# Patient Record
Sex: Female | Born: 1960 | Race: White | Hispanic: No | Marital: Single | Smoking: Never smoker
Health system: Southern US, Community
[De-identification: ages and names within clinical notes are randomized; demographics above are authoritative.]

## PROBLEM LIST (undated history)

## (undated) DIAGNOSIS — I10 Essential (primary) hypertension: Secondary | ICD-10-CM

## (undated) HISTORY — PX: BREAST BIOPSY: SHX20

---

## 2017-03-14 ENCOUNTER — Ambulatory Visit (INDEPENDENT_AMBULATORY_CARE_PROVIDER_SITE_OTHER): Payer: 59

## 2017-03-14 ENCOUNTER — Other Ambulatory Visit: Payer: Self-pay | Admitting: Internal Medicine

## 2017-03-14 DIAGNOSIS — Z1231 Encounter for screening mammogram for malignant neoplasm of breast: Secondary | ICD-10-CM | POA: Diagnosis not present

## 2017-03-16 ENCOUNTER — Other Ambulatory Visit: Payer: Self-pay | Admitting: Internal Medicine

## 2017-03-16 DIAGNOSIS — Z1231 Encounter for screening mammogram for malignant neoplasm of breast: Secondary | ICD-10-CM

## 2019-10-09 ENCOUNTER — Other Ambulatory Visit: Payer: Self-pay | Admitting: Internal Medicine

## 2019-10-09 DIAGNOSIS — Z1231 Encounter for screening mammogram for malignant neoplasm of breast: Secondary | ICD-10-CM

## 2019-10-16 ENCOUNTER — Ambulatory Visit (INDEPENDENT_AMBULATORY_CARE_PROVIDER_SITE_OTHER): Payer: PRIVATE HEALTH INSURANCE

## 2019-10-16 ENCOUNTER — Other Ambulatory Visit: Payer: Self-pay

## 2019-10-16 DIAGNOSIS — Z1231 Encounter for screening mammogram for malignant neoplasm of breast: Secondary | ICD-10-CM | POA: Diagnosis not present

## 2020-11-09 ENCOUNTER — Other Ambulatory Visit: Payer: Self-pay | Admitting: Internal Medicine

## 2020-11-09 ENCOUNTER — Other Ambulatory Visit: Payer: Self-pay | Admitting: Plastic and Reconstructive Surgery

## 2020-11-09 DIAGNOSIS — Z1231 Encounter for screening mammogram for malignant neoplasm of breast: Secondary | ICD-10-CM

## 2020-12-09 ENCOUNTER — Ambulatory Visit: Payer: PRIVATE HEALTH INSURANCE

## 2021-03-17 ENCOUNTER — Ambulatory Visit (HOSPITAL_BASED_OUTPATIENT_CLINIC_OR_DEPARTMENT_OTHER)
Admission: RE | Admit: 2021-03-17 | Discharge: 2021-03-17 | Disposition: A | Discharge status: Home / Self Care | Payer: PRIVATE HEALTH INSURANCE | Source: Ambulatory Visit | Attending: Internal Medicine | Admitting: Internal Medicine

## 2021-03-17 ENCOUNTER — Other Ambulatory Visit: Payer: Self-pay

## 2021-03-17 DIAGNOSIS — Z1231 Encounter for screening mammogram for malignant neoplasm of breast: Secondary | ICD-10-CM | POA: Diagnosis not present

## 2021-03-18 ENCOUNTER — Other Ambulatory Visit (HOSPITAL_BASED_OUTPATIENT_CLINIC_OR_DEPARTMENT_OTHER): Payer: Self-pay

## 2021-03-18 MED ORDER — SERTRALINE HCL 100 MG PO TABS
ORAL_TABLET | ORAL | 3 refills | Status: DC
  Filled 2021-03-18 – 2021-07-12 (×2): qty 135, 90d supply, fill #0
  Filled 2021-11-07: qty 135, 90d supply, fill #1
  Filled 2022-03-14: qty 45, 30d supply, fill #2

## 2021-03-18 MED ORDER — ZOLPIDEM TARTRATE 10 MG PO TABS
ORAL_TABLET | ORAL | 5 refills | Status: DC
  Filled 2021-03-18: qty 30, 30d supply, fill #0
  Filled 2021-04-19: qty 30, 30d supply, fill #1
  Filled 2021-05-19: qty 30, 30d supply, fill #2
  Filled 2021-06-18: qty 30, 30d supply, fill #3
  Filled 2021-07-20: qty 30, 30d supply, fill #4
  Filled 2021-08-18: qty 30, 30d supply, fill #5

## 2021-03-18 MED ORDER — AMLODIPINE BESYLATE 5 MG PO TABS
ORAL_TABLET | ORAL | 3 refills | Status: AC
  Filled 2021-03-18 – 2021-05-19 (×2): qty 90, 90d supply, fill #0

## 2021-03-19 ENCOUNTER — Other Ambulatory Visit (HOSPITAL_BASED_OUTPATIENT_CLINIC_OR_DEPARTMENT_OTHER): Payer: Self-pay

## 2021-04-13 ENCOUNTER — Other Ambulatory Visit (HOSPITAL_BASED_OUTPATIENT_CLINIC_OR_DEPARTMENT_OTHER): Payer: Self-pay

## 2021-04-19 ENCOUNTER — Other Ambulatory Visit (HOSPITAL_BASED_OUTPATIENT_CLINIC_OR_DEPARTMENT_OTHER): Payer: Self-pay

## 2021-04-20 ENCOUNTER — Other Ambulatory Visit (HOSPITAL_BASED_OUTPATIENT_CLINIC_OR_DEPARTMENT_OTHER): Payer: Self-pay

## 2021-05-19 ENCOUNTER — Other Ambulatory Visit (HOSPITAL_BASED_OUTPATIENT_CLINIC_OR_DEPARTMENT_OTHER): Payer: Self-pay

## 2021-05-19 MED ORDER — AMLODIPINE BESYLATE 5 MG PO TABS: 5.0000 mg | ORAL_TABLET | Freq: Every day | ORAL | 1 refills | Status: DC

## 2021-05-19 MED ORDER — ALBUTEROL SULFATE HFA 108 (90 BASE) MCG/ACT IN AERS: INHALATION_SPRAY | RESPIRATORY_TRACT | 1 refills | Status: DC

## 2021-05-19 MED ORDER — ESTRADIOL 1 MG PO TABS
1.0000 mg | ORAL_TABLET | Freq: Every day | ORAL | 0 refills | Status: AC
  Filled 2021-07-12: qty 90, 90d supply, fill #0

## 2021-05-19 MED ORDER — BUSPIRONE HCL 10 MG PO TABS
10.0000 mg | ORAL_TABLET | Freq: Two times a day (BID) | ORAL | 0 refills | Status: DC
  Filled 2021-05-19: qty 180, 90d supply, fill #0

## 2021-05-19 MED ORDER — BISOPROLOL-HYDROCHLOROTHIAZIDE 5-6.25 MG PO TABS
1.0000 | ORAL_TABLET | Freq: Every day | ORAL | 3 refills | Status: AC
  Filled 2021-05-19: qty 90, 90d supply, fill #0

## 2021-05-20 ENCOUNTER — Other Ambulatory Visit (HOSPITAL_BASED_OUTPATIENT_CLINIC_OR_DEPARTMENT_OTHER): Payer: Self-pay

## 2021-05-27 ENCOUNTER — Other Ambulatory Visit (HOSPITAL_BASED_OUTPATIENT_CLINIC_OR_DEPARTMENT_OTHER): Payer: Self-pay

## 2021-05-27 MED ORDER — LOSARTAN POTASSIUM 100 MG PO TABS
ORAL_TABLET | ORAL | 3 refills | Status: DC
  Filled 2021-05-27: qty 90, 90d supply, fill #0
  Filled 2021-08-19: qty 90, 90d supply, fill #1
  Filled 2021-11-23: qty 60, 60d supply, fill #2

## 2021-06-18 ENCOUNTER — Other Ambulatory Visit (HOSPITAL_BASED_OUTPATIENT_CLINIC_OR_DEPARTMENT_OTHER): Payer: Self-pay

## 2021-06-24 ENCOUNTER — Emergency Department (INDEPENDENT_AMBULATORY_CARE_PROVIDER_SITE_OTHER)
Admission: EM | Admit: 2021-06-24 | Discharge: 2021-06-24 | Disposition: A | Discharge status: Home / Self Care | Payer: No Typology Code available for payment source | Source: Home / Self Care | Attending: Family Medicine | Admitting: Family Medicine

## 2021-06-24 ENCOUNTER — Emergency Department (INDEPENDENT_AMBULATORY_CARE_PROVIDER_SITE_OTHER): Payer: No Typology Code available for payment source

## 2021-06-24 ENCOUNTER — Other Ambulatory Visit: Payer: Self-pay

## 2021-06-24 ENCOUNTER — Encounter: Payer: Self-pay | Admitting: Emergency Medicine

## 2021-06-24 DIAGNOSIS — R1084 Generalized abdominal pain: Secondary | ICD-10-CM

## 2021-06-24 DIAGNOSIS — Z8719 Personal history of other diseases of the digestive system: Secondary | ICD-10-CM

## 2021-06-24 DIAGNOSIS — R509 Fever, unspecified: Secondary | ICD-10-CM

## 2021-06-24 HISTORY — DX: Essential (primary) hypertension: I10

## 2021-06-24 LAB — POCT URINALYSIS DIP (MANUAL ENTRY)
Bilirubin, UA: NEGATIVE
Blood, UA: NEGATIVE
Glucose, UA: NEGATIVE mg/dL
Ketones, POC UA: NEGATIVE mg/dL
Leukocytes, UA: NEGATIVE
Nitrite, UA: NEGATIVE
Protein Ur, POC: NEGATIVE mg/dL
Spec Grav, UA: 1.02 (ref 1.010–1.025)
Urobilinogen, UA: 0.2 E.U./dL
pH, UA: 5.5 (ref 5.0–8.0)

## 2021-06-24 LAB — CBC WITH DIFFERENTIAL/PLATELET
Absolute Monocytes: 576 cells/uL (ref 200–950)
Basophils Absolute: 72 cells/uL (ref 0–200)
Basophils Relative: 1.2 %
Eosinophils Absolute: 108 cells/uL (ref 15–500)
Eosinophils Relative: 1.8 %
HCT: 41.4 % (ref 35.0–45.0)
Hemoglobin: 14 g/dL (ref 11.7–15.5)
Lymphs Abs: 1134 cells/uL (ref 850–3900)
MCH: 30.6 pg (ref 27.0–33.0)
MCHC: 33.8 g/dL (ref 32.0–36.0)
MCV: 90.4 fL (ref 80.0–100.0)
MPV: 10.8 fL (ref 7.5–12.5)
Monocytes Relative: 9.6 %
Neutro Abs: 4110 cells/uL (ref 1500–7800)
Neutrophils Relative %: 68.5 %
Platelets: 214 10*3/uL (ref 140–400)
RBC: 4.58 10*6/uL (ref 3.80–5.10)
RDW: 12.4 % (ref 11.0–15.0)
Total Lymphocyte: 18.9 %
WBC: 6 10*3/uL (ref 3.8–10.8)

## 2021-06-24 MED ORDER — HYDROCODONE-ACETAMINOPHEN 5-325 MG PO TABS: 1.0000 | ORAL_TABLET | Freq: Four times a day (QID) | ORAL | 0 refills | Status: AC | PRN

## 2021-06-24 MED ORDER — CIPROFLOXACIN HCL 500 MG PO TABS: 500.0000 mg | ORAL_TABLET | Freq: Two times a day (BID) | ORAL | 0 refills | Status: AC

## 2021-06-24 MED ORDER — METRONIDAZOLE 500 MG PO TABS: 500.0000 mg | ORAL_TABLET | Freq: Two times a day (BID) | ORAL | 0 refills | Status: AC

## 2021-06-24 NOTE — ED Triage Notes (Addendum)
Fever & lower abdominal pain since early Wed morning Negative home Covid test yesterday  Pt concerned for diverticulitis- had a flare up 2 years ago - no surgery   Tmax at home 102 on wed  Temp was 101.2 this am  Tylenol @ 1015- 1000mg  No COVID booster Has taken 3 doses of flagyl - left over meds

## 2021-06-24 NOTE — ED Provider Notes (Signed)
[facility]    CSN: [id number] Arrival date & time: 06/24/21  1138      History   Chief Complaint Chief Complaint  Patient presents with   Abdominal Pain    HPI Kathleen [facility] is a 60 y.o. female.   HPI Patient is a 60 year old woman who has had abdominal pain for 3 days.  She has known diverticular disease.  Had an episode of diverticulitis over a year ago.  She states this feels similar with lower abdominal pain.  Decreased appetite.  Bloated.  This time though she has had fever to 102.  Nausea but no vomiting.  No diarrhea.  She is having daily bowel movements, or couple times a day.  No blood or mucus in the stool. Patient has hypertension that is well controlled with medication.  She is compliant with medical care and preventative medicine.  States her immunizations are up-to-date  Past Medical History:  Diagnosis Date   Hypertension     There are no problems to display for this patient.   Past Surgical History:  Procedure Laterality Date   BREAST BIOPSY      OB History   No obstetric history on file.      Home Medications    Prior to Admission medications   Medication Sig Start Date End Date Taking? Authorizing Provider  ciprofloxacin (CIPRO) 500 MG tablet Take 1 tablet (500 mg total) by mouth 2 (two) times daily. 06/24/21  Yes Eustace Moore, MD  HYDROcodone-acetaminophen (NORCO/VICODIN) 5-325 MG tablet Take 1-2 tablets by mouth every 6 (six) hours as needed. 06/24/21  Yes Eustace Moore, MD  metroNIDAZOLE (FLAGYL) 500 MG tablet Take 1 tablet (500 mg total) by mouth 2 (two) times daily. 06/24/21  Yes Eustace Moore, MD  amLODipine (NORVASC) 5 MG tablet TAKE 1 TABLET(5 MG) BY MOUTH DAILY FOR HIGH BLOOD PRESSURE 03/18/21     bisoprolol-hydrochlorothiazide (ZIAC) 5-6.25 MG tablet Take 1 tablet by mouth daily. 06/12/20     busPIRone (BUSPAR) 10 MG tablet Take 1 tablet by mouth twice daily as needed for anxiety. 05/29/20     estradiol (ESTRACE)  1 MG tablet Take 1 tablet (1 mg total) by mouth daily. 10/13/20     losartan (COZAAR) 100 MG tablet TAKE 1 TABLET(100 MG) BY MOUTH DAILY 05/27/21     sertraline (ZOLOFT) 100 MG tablet TAKE 1 AND 1/2 TABLETS(150 MG) BY MOUTH DAILY 03/18/21     zolpidem (AMBIEN) 10 MG tablet TAKE 1 TABLET (10 MG) BY MOUTH EVERY NIGHT AS NEEDED FOR SLEEP 03/18/21       Family History Family History  Problem Relation Age of Onset   Heart failure Mother    Kidney failure Mother    Healthy Father     Social History Social History   Tobacco Use   Smoking status: Never    Passive exposure: Never   Smokeless tobacco: Never  Vaping Use   Vaping Use: Never used  Substance Use Topics   Alcohol use: Yes    Alcohol/week: 4.0 standard drinks    Types: 4 Standard drinks or equivalent per week   Drug use: Never     Allergies   Patient has no known allergies.   Review of Systems Review of Systems See HPI  Physical Exam Triage Vital Signs ED Triage Vitals  Enc Vitals Group     BP 06/24/21 1151 (!) 143/89     Pulse Rate 06/24/21 1151 69     Resp 06/24/21 1151  16     Temp 06/24/21 1151 98.7 F (37.1 C)     Temp Source 06/24/21 1151 Oral     SpO2 06/24/21 1151 97 %     Weight 06/24/21 1156 175 lb (79.4 kg)     Height 06/24/21 1156 5\' 4"  (1.626 m)     Head Circumference --      Peak Flow --      Pain Score 06/24/21 1154 4     Pain Loc --      Pain Edu? --      Excl. in GC? --    No data found.  Updated Vital Signs BP (!) 143/89 (BP Location: Left Arm)   Pulse 69   Temp 98.7 F (37.1 C) (Oral)   Resp 16   Ht 5\' 4"  (1.626 m)   Wt 79.4 kg   SpO2 97%   BMI 30.04 kg/m       Physical Exam Constitutional:      General: She is not in acute distress.    Appearance: She is well-developed.  HENT:     Head: Normocephalic and atraumatic.     Mouth/Throat:     Mouth: Mucous membranes are moist.     Pharynx: No oropharyngeal exudate.  Eyes:     Conjunctiva/sclera: Conjunctivae normal.      Pupils: Pupils are equal, round, and reactive to light.  Cardiovascular:     Rate and Rhythm: Normal rate and regular rhythm.     Heart sounds: Normal heart sounds.  Pulmonary:     Effort: Pulmonary effort is normal. No respiratory distress.     Breath sounds: No wheezing, rhonchi or rales.  Abdominal:     General: Abdomen is protuberant. Bowel sounds are decreased. There is no distension.     Palpations: Abdomen is soft. There is no hepatomegaly or splenomegaly.     Tenderness: There is generalized abdominal tenderness and tenderness in the epigastric area.     Comments: Abdomen does appear slightly rounded.  Bowel sounds are in active.  There is tenderness palpation in the periumbilical and suprapubic region and towards the left lower abdomen.  No masses palpable.  No guarding or rebound  Musculoskeletal:        General: Normal range of motion.     Cervical back: Normal range of motion.  Skin:    General: Skin is warm and dry.  Neurological:     General: No focal deficit present.     Mental Status: She is alert.  Psychiatric:        Mood and Affect: Mood normal.        Behavior: Behavior normal.     UC Treatments / Results  Labs (all labs ordered are listed, but only abnormal results are displayed) Labs Reviewed  CBC WITH DIFFERENTIAL/PLATELET  POCT URINALYSIS DIP (MANUAL ENTRY)    EKG   Radiology DG Abdomen Acute W/Chest  Result Date: 06/24/2021 CLINICAL DATA:  Pain, fever EXAM: DG ABDOMEN ACUTE WITH 1 VIEW CHEST COMPARISON:  None FINDINGS: Linear densities in the left lung base could reflect atelectasis or scarring. Right lung clear. Heart is normal size. No effusions. There is normal bowel gas pattern. No free air. No organomegaly or suspicious calcification. No acute bony abnormality. IMPRESSION: Left base atelectasis or scarring. No acute findings. Electronically Signed   By: M.D.   On: 06/24/2021 12:41    Procedures Procedures (including critical care  time)  Medications Ordered in UC Medications - No  data to display  Initial Impression / Assessment and Plan / UC Course  I have reviewed the triage vital signs and the nursing notes.  Pertinent labs & imaging results that were available during my care of the patient were reviewed by me and considered in my medical decision making (see chart for details).     Patient does have an acute abdomen that looks surgical at this time.  I Minna treat her for diverticulitis with the caution that if she fails to improve, or she gets worse instead of better she must go to emergency room for additional care. Final Clinical Impressions(s) / UC Diagnoses   Final diagnoses:  Acute generalized abdominal pain  History of diverticulitis     Discharge Instructions      Continue to drink lots of fluids Small meals with bland foods Take Cipro antibiotic 2 times a day Take metronidazole antibiotic 2 times a day These are the same antibiotics use for your last diverticulitis flare, and are still appropriate You may take Tylenol, Advil, or Aleve for moderate pain.  I have provided hydrocodone to take when pain is severe.  Hydrocodone can cause drowsiness, so do not drive on this medicine.  It also can cause constipation so make sure you get enough fiber and drink enough water. Call your doctor if not improving by Monday Go to the ER if you are worse instead of better at any time   ED Prescriptions     Medication Sig Dispense Auth. Provider   ciprofloxacin (CIPRO) 500 MG tablet Take 1 tablet (500 mg total) by mouth 2 (two) times daily. 20 tablet Eustace Moore, MD   metroNIDAZOLE (FLAGYL) 500 MG tablet Take 1 tablet (500 mg total) by mouth 2 (two) times daily. 14 tablet Eustace Moore, MD   HYDROcodone-acetaminophen (NORCO/VICODIN) 5-325 MG tablet Take 1-2 tablets by mouth every 6 (six) hours as needed. 15 tablet Eustace Moore, MD      I have reviewed the PDMP during this encounter.    Eustace Moore, MD 06/24/21 1420

## 2021-06-24 NOTE — Discharge Instructions (Signed)
Continue to drink lots of fluids Small meals with bland foods Take Cipro antibiotic 2 times a day Take metronidazole antibiotic 2 times a day These are the same antibiotics use for your last diverticulitis flare, and are still appropriate You may take Tylenol, Advil, or Aleve for moderate pain.  I have provided hydrocodone to take when pain is severe.  Hydrocodone can cause drowsiness, so do not drive on this medicine.  It also can cause constipation so make sure you get enough fiber and drink enough water. Call your doctor if not improving by Monday Go to the ER if you are worse instead of better at any time

## 2021-07-12 ENCOUNTER — Other Ambulatory Visit (HOSPITAL_BASED_OUTPATIENT_CLINIC_OR_DEPARTMENT_OTHER): Payer: Self-pay

## 2021-07-20 ENCOUNTER — Other Ambulatory Visit (HOSPITAL_BASED_OUTPATIENT_CLINIC_OR_DEPARTMENT_OTHER): Payer: Self-pay

## 2021-08-12 ENCOUNTER — Other Ambulatory Visit (HOSPITAL_BASED_OUTPATIENT_CLINIC_OR_DEPARTMENT_OTHER): Payer: Self-pay

## 2021-08-12 MED ORDER — CIPROFLOXACIN HCL 500 MG PO TABS
ORAL_TABLET | ORAL | 0 refills | Status: DC
  Filled 2021-08-12: qty 16, 8d supply, fill #0

## 2021-08-12 MED ORDER — AMLODIPINE BESYLATE 5 MG PO TABS: ORAL_TABLET | ORAL | 3 refills | Status: AC

## 2021-08-13 ENCOUNTER — Other Ambulatory Visit (HOSPITAL_BASED_OUTPATIENT_CLINIC_OR_DEPARTMENT_OTHER): Payer: Self-pay

## 2021-08-13 MED ORDER — AMLODIPINE BESYLATE 5 MG PO TABS
ORAL_TABLET | ORAL | 3 refills | Status: DC
  Filled 2021-08-13: qty 180, 90d supply, fill #0
  Filled 2021-12-15: qty 180, 90d supply, fill #1
  Filled 2022-03-30: qty 180, 90d supply, fill #2

## 2021-08-18 ENCOUNTER — Other Ambulatory Visit (HOSPITAL_BASED_OUTPATIENT_CLINIC_OR_DEPARTMENT_OTHER): Payer: Self-pay

## 2021-08-18 MED ORDER — BUSPIRONE HCL 10 MG PO TABS
10.0000 mg | ORAL_TABLET | Freq: Two times a day (BID) | ORAL | 0 refills | Status: AC | PRN
  Filled 2021-08-18: qty 180, 90d supply, fill #0

## 2021-08-19 ENCOUNTER — Other Ambulatory Visit (HOSPITAL_BASED_OUTPATIENT_CLINIC_OR_DEPARTMENT_OTHER): Payer: Self-pay

## 2021-08-25 ENCOUNTER — Other Ambulatory Visit: Payer: Self-pay | Admitting: Internal Medicine

## 2021-08-25 DIAGNOSIS — R748 Abnormal levels of other serum enzymes: Secondary | ICD-10-CM

## 2021-08-27 ENCOUNTER — Other Ambulatory Visit: Payer: Self-pay

## 2021-08-27 ENCOUNTER — Other Ambulatory Visit: Payer: Self-pay | Admitting: Internal Medicine

## 2021-08-27 ENCOUNTER — Ambulatory Visit (INDEPENDENT_AMBULATORY_CARE_PROVIDER_SITE_OTHER): Payer: No Typology Code available for payment source

## 2021-08-27 DIAGNOSIS — R748 Abnormal levels of other serum enzymes: Secondary | ICD-10-CM

## 2021-09-06 ENCOUNTER — Other Ambulatory Visit (HOSPITAL_BASED_OUTPATIENT_CLINIC_OR_DEPARTMENT_OTHER): Payer: Self-pay

## 2021-09-06 MED ORDER — BISOPROLOL-HYDROCHLOROTHIAZIDE 5-6.25 MG PO TABS
1.0000 | ORAL_TABLET | Freq: Every day | ORAL | 3 refills | Status: AC
  Filled 2021-09-06: qty 90, 90d supply, fill #0
  Filled 2021-12-10: qty 90, 90d supply, fill #1
  Filled 2022-03-02: qty 30, 30d supply, fill #2
  Filled 2022-04-14: qty 30, 30d supply, fill #3
  Filled 2022-05-16: qty 30, 30d supply, fill #4
  Filled 2022-06-09: qty 30, 30d supply, fill #5
  Filled 2022-07-14: qty 30, 30d supply, fill #6

## 2021-09-06 MED ORDER — VALACYCLOVIR HCL 1 G PO TABS
1000.0000 mg | ORAL_TABLET | Freq: Every day | ORAL | 2 refills | Status: AC
  Filled 2021-09-06: qty 90, 90d supply, fill #0

## 2021-09-07 ENCOUNTER — Other Ambulatory Visit (HOSPITAL_BASED_OUTPATIENT_CLINIC_OR_DEPARTMENT_OTHER): Payer: Self-pay

## 2021-09-10 ENCOUNTER — Other Ambulatory Visit (HOSPITAL_BASED_OUTPATIENT_CLINIC_OR_DEPARTMENT_OTHER): Payer: Self-pay

## 2021-09-13 ENCOUNTER — Other Ambulatory Visit (HOSPITAL_BASED_OUTPATIENT_CLINIC_OR_DEPARTMENT_OTHER): Payer: Self-pay

## 2021-09-13 MED ORDER — ZOLPIDEM TARTRATE 10 MG PO TABS
ORAL_TABLET | ORAL | 2 refills | Status: DC
  Filled 2021-09-15: qty 30, 30d supply, fill #0
  Filled 2021-10-15: qty 30, 30d supply, fill #1
  Filled 2021-11-15: qty 30, 30d supply, fill #2

## 2021-09-15 ENCOUNTER — Other Ambulatory Visit (HOSPITAL_BASED_OUTPATIENT_CLINIC_OR_DEPARTMENT_OTHER): Payer: Self-pay

## 2021-10-15 ENCOUNTER — Other Ambulatory Visit (HOSPITAL_BASED_OUTPATIENT_CLINIC_OR_DEPARTMENT_OTHER): Payer: Self-pay

## 2021-10-15 MED ORDER — ESTRADIOL 1 MG PO TABS
1.0000 mg | ORAL_TABLET | Freq: Every day | ORAL | 0 refills | Status: DC
  Filled 2021-10-15: qty 90, 90d supply, fill #0

## 2021-11-08 ENCOUNTER — Other Ambulatory Visit (HOSPITAL_BASED_OUTPATIENT_CLINIC_OR_DEPARTMENT_OTHER): Payer: Self-pay

## 2021-11-15 ENCOUNTER — Other Ambulatory Visit (HOSPITAL_BASED_OUTPATIENT_CLINIC_OR_DEPARTMENT_OTHER): Payer: Self-pay

## 2021-11-23 ENCOUNTER — Other Ambulatory Visit (HOSPITAL_BASED_OUTPATIENT_CLINIC_OR_DEPARTMENT_OTHER): Payer: Self-pay

## 2021-11-24 ENCOUNTER — Other Ambulatory Visit (HOSPITAL_BASED_OUTPATIENT_CLINIC_OR_DEPARTMENT_OTHER): Payer: Self-pay

## 2021-11-24 MED ORDER — BUSPIRONE HCL 10 MG PO TABS
10.0000 mg | ORAL_TABLET | Freq: Two times a day (BID) | ORAL | 3 refills | Status: DC | PRN
  Filled 2021-11-24: qty 180, 90d supply, fill #0
  Filled 2022-03-02: qty 60, 30d supply, fill #1
  Filled 2022-03-30: qty 60, 30d supply, fill #2
  Filled 2022-05-06: qty 60, 30d supply, fill #3
  Filled 2022-06-09: qty 60, 30d supply, fill #4
  Filled 2022-07-12: qty 60, 30d supply, fill #5
  Filled 2022-08-24: qty 60, 30d supply, fill #6
  Filled 2022-09-28: qty 60, 30d supply, fill #7
  Filled 2022-10-27: qty 60, 30d supply, fill #8

## 2021-12-10 ENCOUNTER — Other Ambulatory Visit (HOSPITAL_BASED_OUTPATIENT_CLINIC_OR_DEPARTMENT_OTHER): Payer: Self-pay

## 2021-12-10 MED ORDER — ZOLPIDEM TARTRATE 10 MG PO TABS
ORAL_TABLET | ORAL | 2 refills | Status: DC
  Filled 2021-12-10: qty 30, 30d supply, fill #0
  Filled 2022-01-12: qty 30, 30d supply, fill #1
  Filled 2022-02-10: qty 30, 30d supply, fill #2

## 2021-12-15 ENCOUNTER — Other Ambulatory Visit (HOSPITAL_BASED_OUTPATIENT_CLINIC_OR_DEPARTMENT_OTHER): Payer: Self-pay

## 2022-01-12 ENCOUNTER — Other Ambulatory Visit (HOSPITAL_BASED_OUTPATIENT_CLINIC_OR_DEPARTMENT_OTHER): Payer: Self-pay

## 2022-01-17 ENCOUNTER — Other Ambulatory Visit (HOSPITAL_BASED_OUTPATIENT_CLINIC_OR_DEPARTMENT_OTHER): Payer: Self-pay

## 2022-01-18 ENCOUNTER — Other Ambulatory Visit (HOSPITAL_BASED_OUTPATIENT_CLINIC_OR_DEPARTMENT_OTHER): Payer: Self-pay

## 2022-01-18 MED ORDER — ESTRADIOL 1 MG PO TABS
1.0000 mg | ORAL_TABLET | Freq: Every day | ORAL | 3 refills | Status: DC
  Filled 2022-01-18: qty 30, 30d supply, fill #0
  Filled 2022-02-16: qty 30, 30d supply, fill #1
  Filled 2022-03-14: qty 30, 30d supply, fill #2
  Filled 2022-04-19: qty 30, 30d supply, fill #3
  Filled 2022-05-23: qty 30, 30d supply, fill #4
  Filled 2022-06-21: qty 30, 30d supply, fill #5
  Filled 2022-07-29: qty 30, 30d supply, fill #6
  Filled 2022-08-24: qty 30, 30d supply, fill #7
  Filled 2022-09-28: qty 30, 30d supply, fill #8
  Filled 2022-10-27: qty 30, 30d supply, fill #9
  Filled 2022-11-28 – 2022-12-07 (×2): qty 30, 30d supply, fill #10
  Filled 2023-01-03: qty 30, 30d supply, fill #11

## 2022-01-19 ENCOUNTER — Other Ambulatory Visit (HOSPITAL_BASED_OUTPATIENT_CLINIC_OR_DEPARTMENT_OTHER): Payer: Self-pay

## 2022-01-19 MED ORDER — CIPROFLOXACIN HCL 500 MG PO TABS
ORAL_TABLET | ORAL | 0 refills | Status: AC
  Filled 2022-01-19: qty 16, 8d supply, fill #0

## 2022-01-20 ENCOUNTER — Other Ambulatory Visit (HOSPITAL_BASED_OUTPATIENT_CLINIC_OR_DEPARTMENT_OTHER): Payer: Self-pay

## 2022-02-10 ENCOUNTER — Other Ambulatory Visit (HOSPITAL_BASED_OUTPATIENT_CLINIC_OR_DEPARTMENT_OTHER): Payer: Self-pay

## 2022-02-11 ENCOUNTER — Other Ambulatory Visit (HOSPITAL_BASED_OUTPATIENT_CLINIC_OR_DEPARTMENT_OTHER): Payer: Self-pay

## 2022-02-16 ENCOUNTER — Other Ambulatory Visit (HOSPITAL_BASED_OUTPATIENT_CLINIC_OR_DEPARTMENT_OTHER): Payer: Self-pay

## 2022-03-02 ENCOUNTER — Other Ambulatory Visit (HOSPITAL_BASED_OUTPATIENT_CLINIC_OR_DEPARTMENT_OTHER): Payer: Self-pay

## 2022-03-02 MED ORDER — LOSARTAN POTASSIUM 100 MG PO TABS
ORAL_TABLET | ORAL | 3 refills | Status: AC
  Filled 2022-03-02: qty 30, 30d supply, fill #0
  Filled 2022-05-16: qty 30, 30d supply, fill #1
  Filled 2022-10-17 (×2): qty 30, 30d supply, fill #2
  Filled 2023-01-03: qty 30, 30d supply, fill #3

## 2022-03-04 ENCOUNTER — Other Ambulatory Visit (HOSPITAL_BASED_OUTPATIENT_CLINIC_OR_DEPARTMENT_OTHER): Payer: Self-pay

## 2022-03-14 ENCOUNTER — Other Ambulatory Visit (HOSPITAL_BASED_OUTPATIENT_CLINIC_OR_DEPARTMENT_OTHER): Payer: Self-pay

## 2022-03-15 ENCOUNTER — Other Ambulatory Visit (HOSPITAL_BASED_OUTPATIENT_CLINIC_OR_DEPARTMENT_OTHER): Payer: Self-pay

## 2022-03-15 MED ORDER — ZOLPIDEM TARTRATE 10 MG PO TABS
ORAL_TABLET | ORAL | 2 refills | Status: AC
  Filled 2022-03-15: qty 30, 30d supply, fill #0
  Filled 2022-04-14: qty 30, 30d supply, fill #1
  Filled 2022-05-16: qty 30, 30d supply, fill #2

## 2022-03-30 ENCOUNTER — Other Ambulatory Visit (HOSPITAL_BASED_OUTPATIENT_CLINIC_OR_DEPARTMENT_OTHER): Payer: Self-pay

## 2022-04-14 ENCOUNTER — Other Ambulatory Visit (HOSPITAL_BASED_OUTPATIENT_CLINIC_OR_DEPARTMENT_OTHER): Payer: Self-pay

## 2022-04-19 ENCOUNTER — Other Ambulatory Visit (HOSPITAL_BASED_OUTPATIENT_CLINIC_OR_DEPARTMENT_OTHER): Payer: Self-pay

## 2022-05-06 ENCOUNTER — Other Ambulatory Visit (HOSPITAL_BASED_OUTPATIENT_CLINIC_OR_DEPARTMENT_OTHER): Payer: Self-pay

## 2022-05-09 ENCOUNTER — Other Ambulatory Visit (HOSPITAL_BASED_OUTPATIENT_CLINIC_OR_DEPARTMENT_OTHER): Payer: Self-pay

## 2022-05-09 MED ORDER — SERTRALINE HCL 100 MG PO TABS
ORAL_TABLET | ORAL | 3 refills | Status: AC
  Filled 2022-05-09: qty 45, 30d supply, fill #0
  Filled 2022-06-22: qty 45, 30d supply, fill #1
  Filled 2022-08-12: qty 135, 90d supply, fill #2
  Filled 2022-12-21: qty 135, 90d supply, fill #3
  Filled 2023-03-30: qty 135, 90d supply, fill #4

## 2022-05-16 ENCOUNTER — Other Ambulatory Visit (HOSPITAL_BASED_OUTPATIENT_CLINIC_OR_DEPARTMENT_OTHER): Payer: Self-pay

## 2022-05-23 ENCOUNTER — Other Ambulatory Visit (HOSPITAL_BASED_OUTPATIENT_CLINIC_OR_DEPARTMENT_OTHER): Payer: Self-pay

## 2022-06-09 ENCOUNTER — Other Ambulatory Visit (HOSPITAL_BASED_OUTPATIENT_CLINIC_OR_DEPARTMENT_OTHER): Payer: Self-pay

## 2022-06-21 ENCOUNTER — Other Ambulatory Visit (HOSPITAL_BASED_OUTPATIENT_CLINIC_OR_DEPARTMENT_OTHER): Payer: Self-pay

## 2022-06-22 ENCOUNTER — Other Ambulatory Visit (HOSPITAL_BASED_OUTPATIENT_CLINIC_OR_DEPARTMENT_OTHER): Payer: Self-pay

## 2022-06-23 ENCOUNTER — Other Ambulatory Visit (HOSPITAL_BASED_OUTPATIENT_CLINIC_OR_DEPARTMENT_OTHER): Payer: Self-pay

## 2022-07-07 ENCOUNTER — Other Ambulatory Visit (HOSPITAL_BASED_OUTPATIENT_CLINIC_OR_DEPARTMENT_OTHER): Payer: Self-pay

## 2022-07-12 ENCOUNTER — Other Ambulatory Visit (HOSPITAL_BASED_OUTPATIENT_CLINIC_OR_DEPARTMENT_OTHER): Payer: Self-pay

## 2022-07-14 ENCOUNTER — Other Ambulatory Visit (HOSPITAL_BASED_OUTPATIENT_CLINIC_OR_DEPARTMENT_OTHER): Payer: Self-pay

## 2022-07-29 ENCOUNTER — Other Ambulatory Visit (HOSPITAL_BASED_OUTPATIENT_CLINIC_OR_DEPARTMENT_OTHER): Payer: Self-pay

## 2022-08-12 ENCOUNTER — Other Ambulatory Visit (HOSPITAL_BASED_OUTPATIENT_CLINIC_OR_DEPARTMENT_OTHER): Payer: Self-pay

## 2022-08-24 ENCOUNTER — Other Ambulatory Visit (HOSPITAL_BASED_OUTPATIENT_CLINIC_OR_DEPARTMENT_OTHER): Payer: Self-pay

## 2022-08-24 MED ORDER — AMLODIPINE BESYLATE 5 MG PO TABS
5.0000 mg | ORAL_TABLET | Freq: Two times a day (BID) | ORAL | 1 refills | Status: DC
  Filled 2022-08-24: qty 60, 30d supply, fill #0
  Filled 2022-10-17: qty 60, 30d supply, fill #1
  Filled 2022-11-24: qty 60, 30d supply, fill #2
  Filled 2023-01-02 – 2023-01-03 (×2): qty 60, 30d supply, fill #3
  Filled 2023-01-29: qty 60, 30d supply, fill #4
  Filled 2023-03-08: qty 60, 30d supply, fill #5

## 2022-09-07 ENCOUNTER — Other Ambulatory Visit (HOSPITAL_BASED_OUTPATIENT_CLINIC_OR_DEPARTMENT_OTHER): Payer: Self-pay

## 2022-09-28 ENCOUNTER — Other Ambulatory Visit (HOSPITAL_BASED_OUTPATIENT_CLINIC_OR_DEPARTMENT_OTHER): Payer: Self-pay

## 2022-10-17 ENCOUNTER — Other Ambulatory Visit (HOSPITAL_BASED_OUTPATIENT_CLINIC_OR_DEPARTMENT_OTHER): Payer: Self-pay

## 2022-11-24 ENCOUNTER — Other Ambulatory Visit (HOSPITAL_BASED_OUTPATIENT_CLINIC_OR_DEPARTMENT_OTHER): Payer: Self-pay

## 2022-12-05 ENCOUNTER — Other Ambulatory Visit (HOSPITAL_BASED_OUTPATIENT_CLINIC_OR_DEPARTMENT_OTHER): Payer: Self-pay

## 2022-12-05 MED ORDER — BUSPIRONE HCL 10 MG PO TABS
10.0000 mg | ORAL_TABLET | Freq: Two times a day (BID) | ORAL | 3 refills | Status: AC | PRN
  Filled 2022-12-05: qty 60, 30d supply, fill #0
  Filled 2023-01-02: qty 60, 30d supply, fill #1
  Filled 2023-01-03: qty 19, 9d supply, fill #1
  Filled 2023-01-03: qty 41, 21d supply, fill #1
  Filled 2023-01-29: qty 60, 30d supply, fill #2
  Filled 2023-03-08: qty 60, 30d supply, fill #3
  Filled 2023-04-12: qty 60, 30d supply, fill #4

## 2022-12-07 ENCOUNTER — Other Ambulatory Visit (HOSPITAL_BASED_OUTPATIENT_CLINIC_OR_DEPARTMENT_OTHER): Payer: Self-pay

## 2022-12-21 ENCOUNTER — Other Ambulatory Visit (HOSPITAL_BASED_OUTPATIENT_CLINIC_OR_DEPARTMENT_OTHER): Payer: Self-pay

## 2022-12-28 ENCOUNTER — Other Ambulatory Visit (HOSPITAL_BASED_OUTPATIENT_CLINIC_OR_DEPARTMENT_OTHER): Payer: Self-pay

## 2023-01-02 ENCOUNTER — Other Ambulatory Visit: Payer: Self-pay

## 2023-01-02 ENCOUNTER — Other Ambulatory Visit (HOSPITAL_BASED_OUTPATIENT_CLINIC_OR_DEPARTMENT_OTHER): Payer: Self-pay

## 2023-01-03 ENCOUNTER — Other Ambulatory Visit (HOSPITAL_BASED_OUTPATIENT_CLINIC_OR_DEPARTMENT_OTHER): Payer: Self-pay

## 2023-01-27 IMAGING — DX DG ABDOMEN ACUTE W/ 1V CHEST
4 series · 4 of 4 positions shown · non-contrast
Comparison: None

CLINICAL DATA: Pain, fever

EXAM:
DG ABDOMEN ACUTE WITH 1 VIEW CHEST

[chest pa]
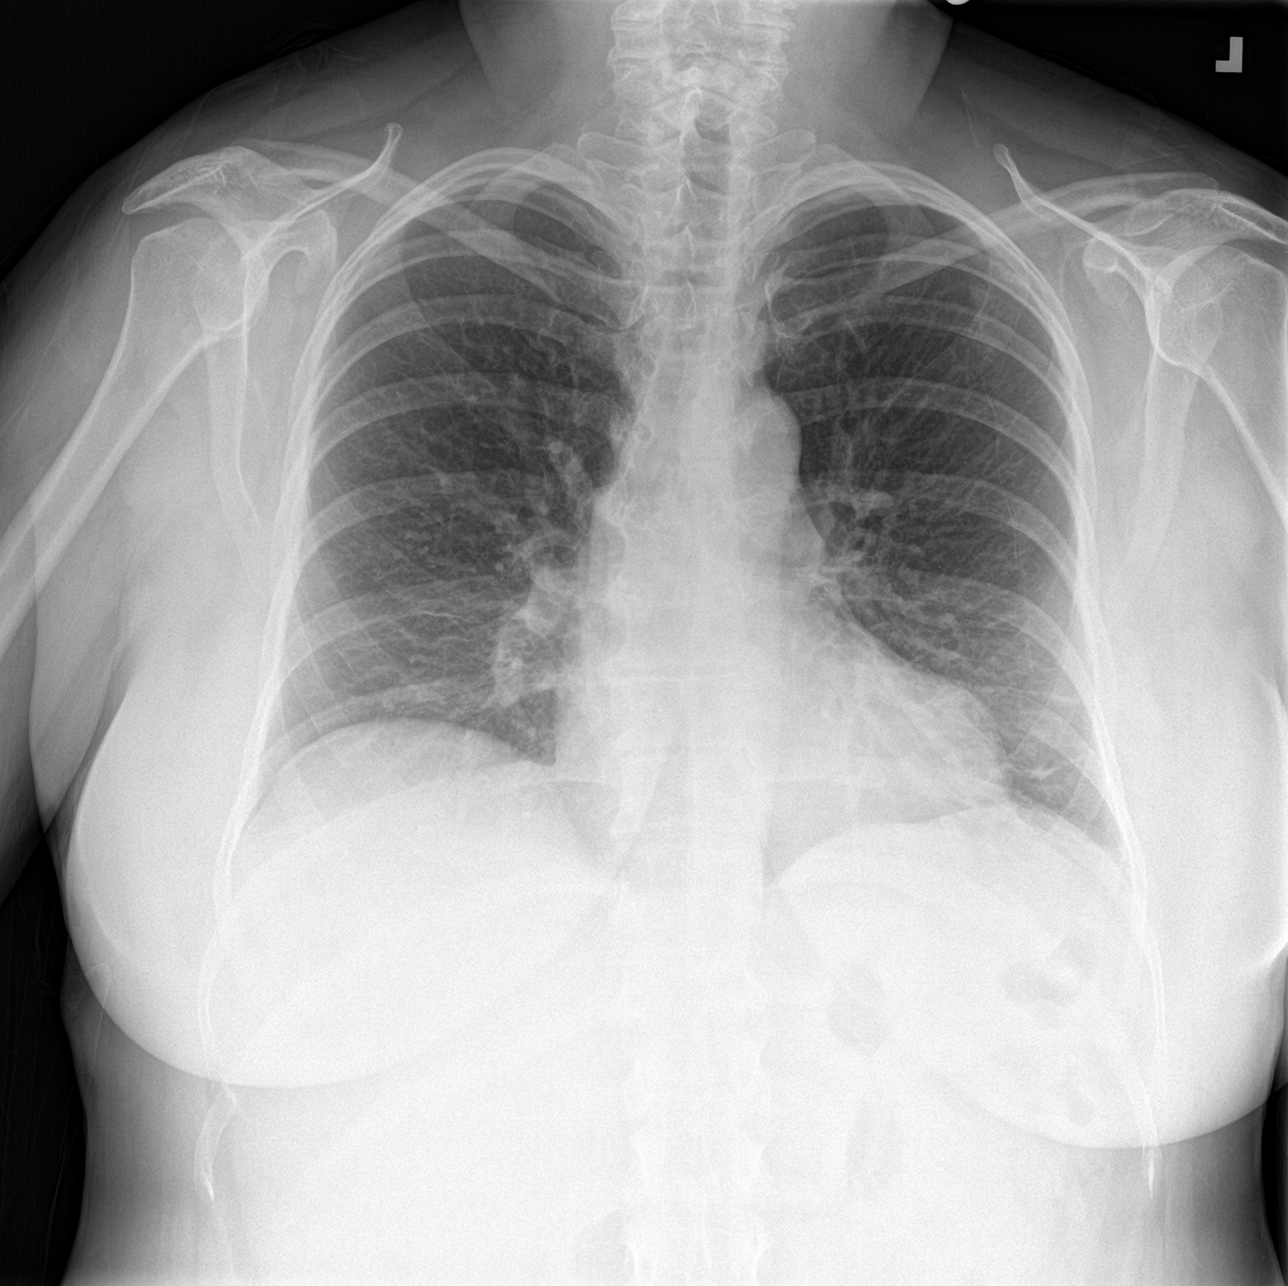

[abdomen erect]
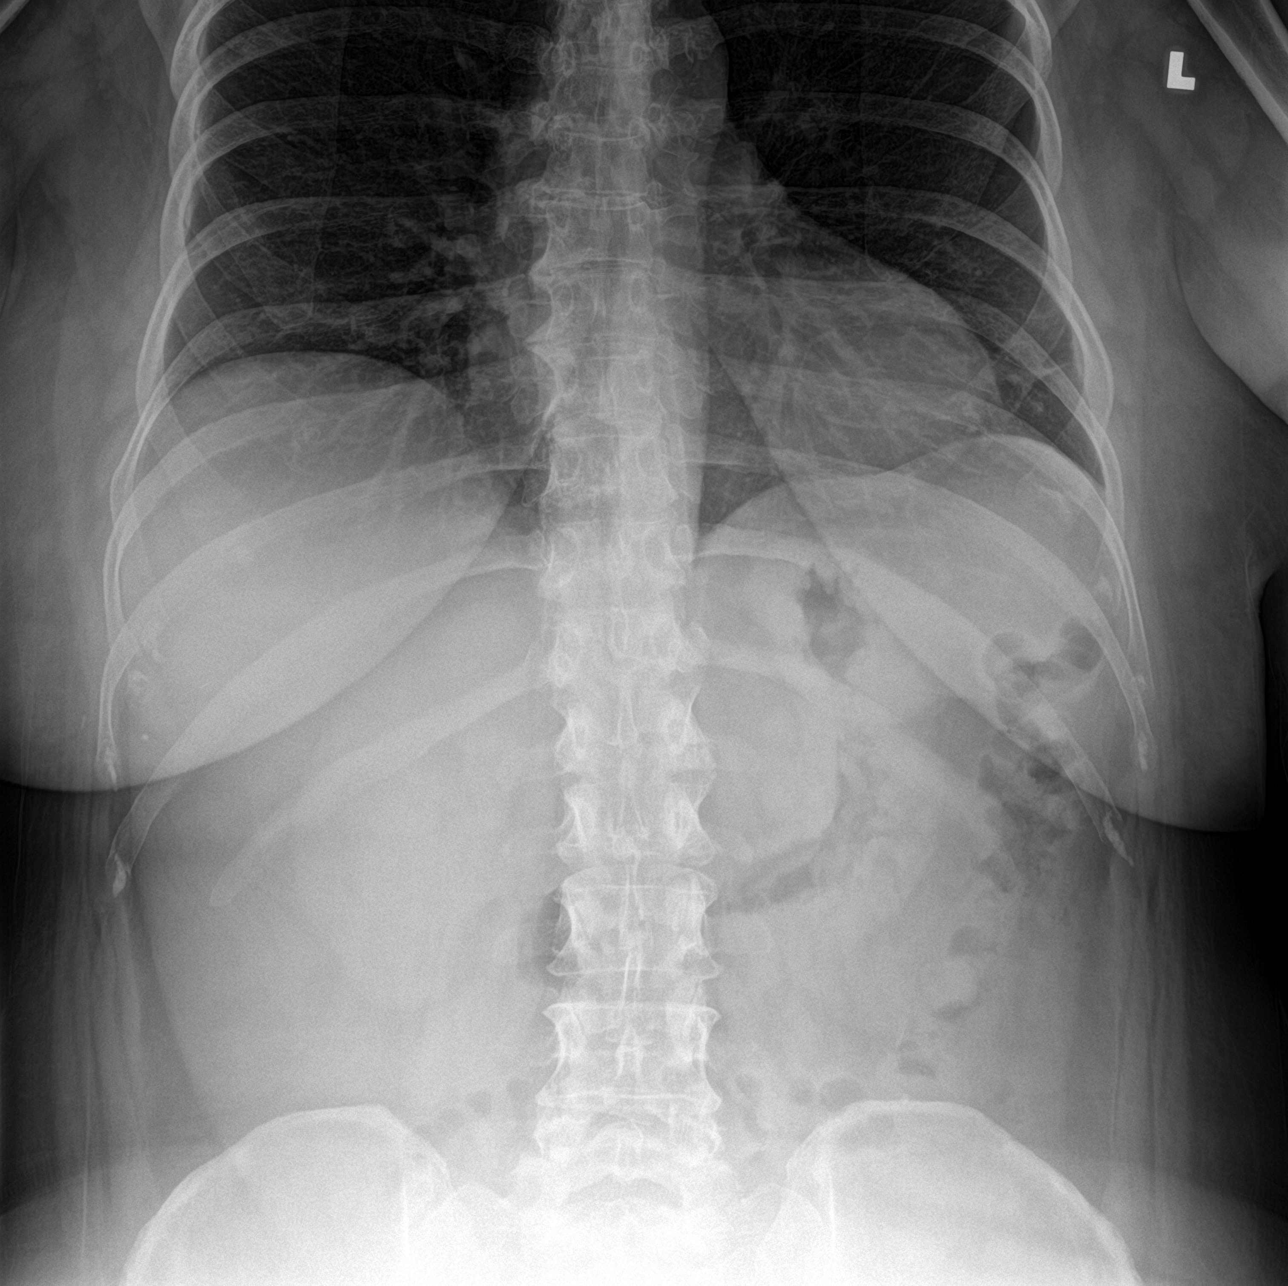

[abdomen supine (1 of 2)]
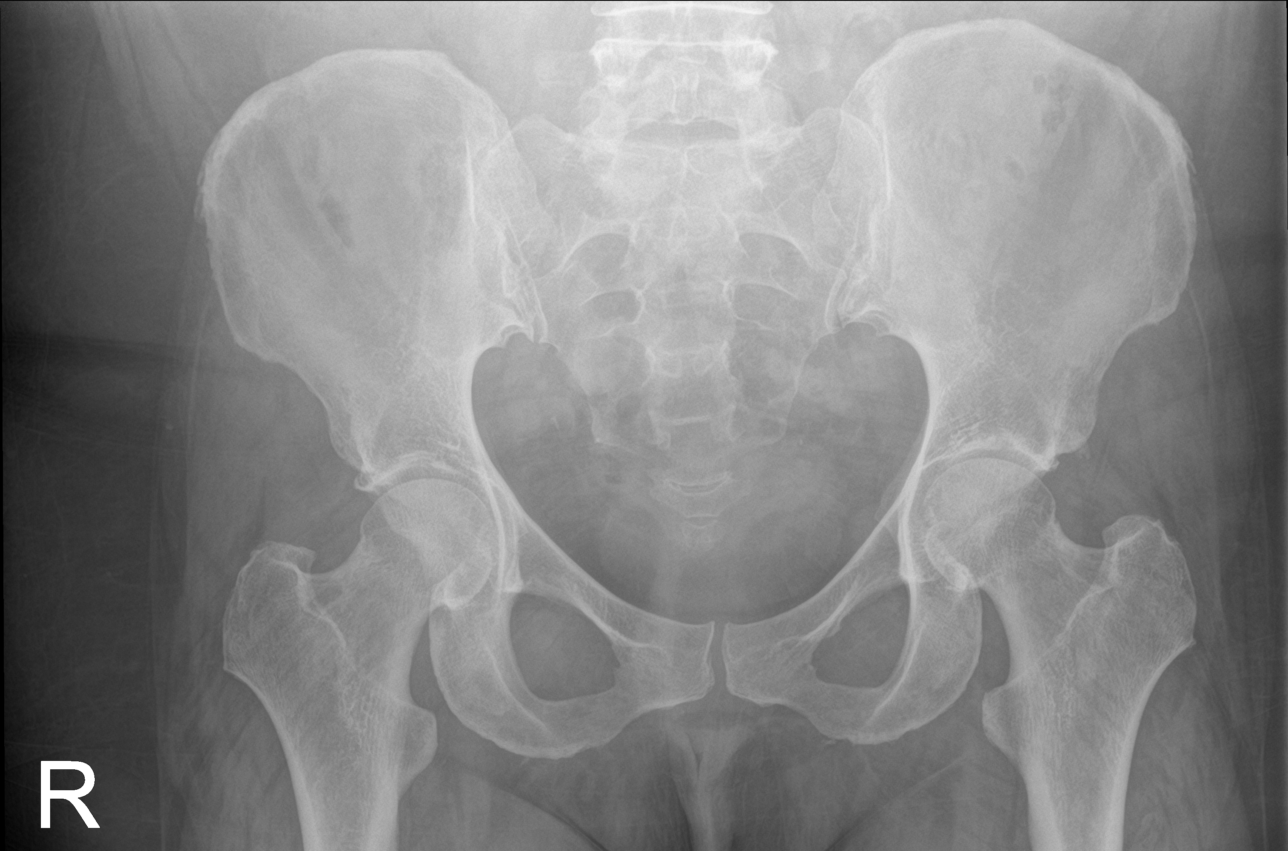

[abdomen supine (2 of 2)]
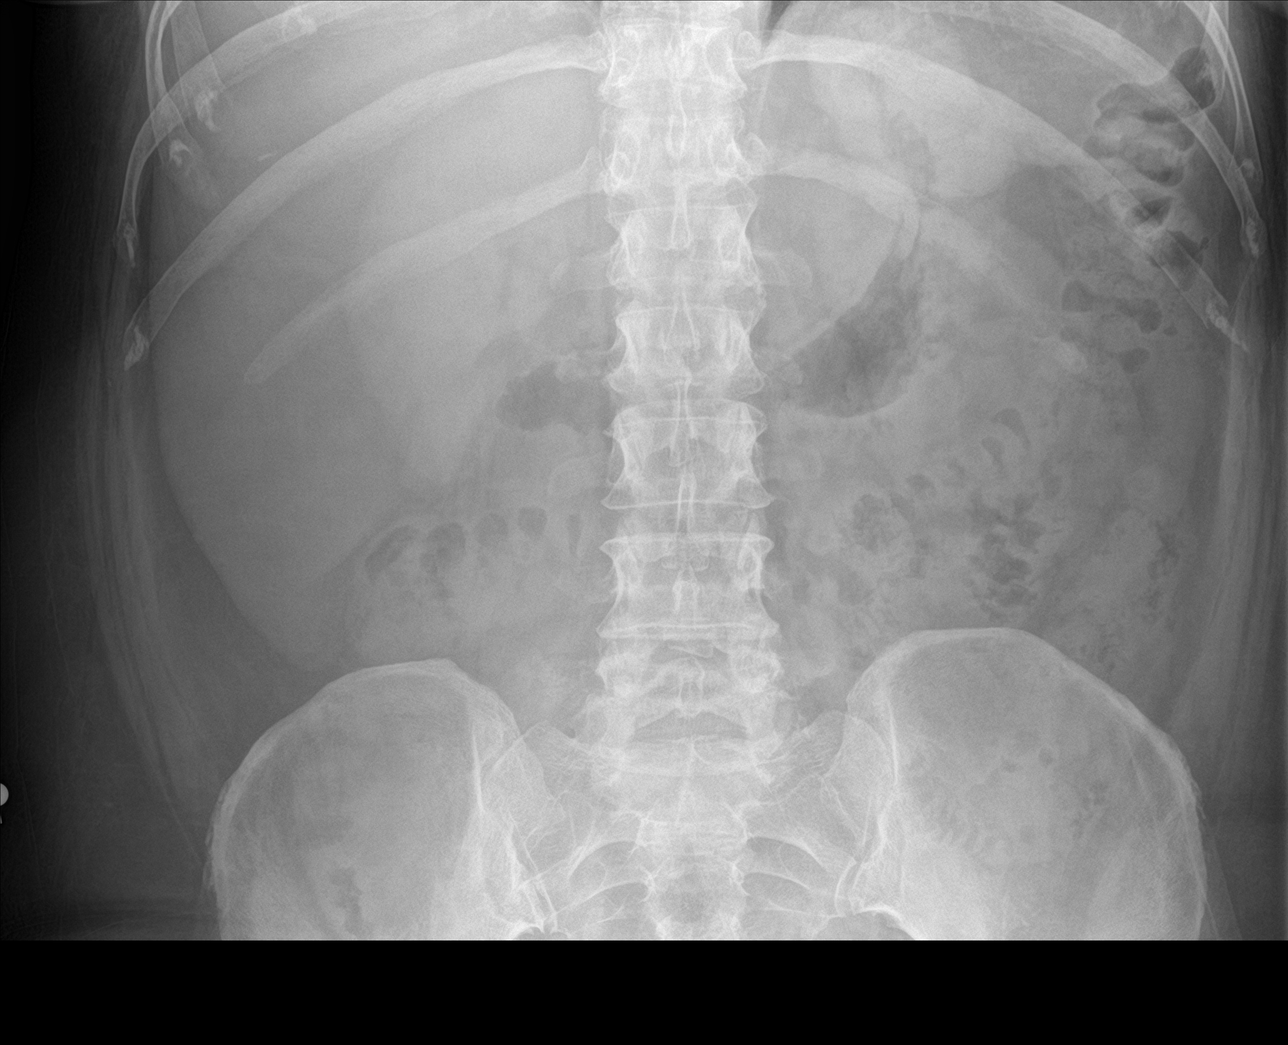

[4 of 4 positions shown; findings below may reference images not displayed]

FINDINGS: Linear densities in the left lung base could reflect atelectasis or
scarring. Right lung clear. Heart is normal size. No effusions.

There is normal bowel gas pattern. No free air. No organomegaly or
suspicious calcification. No acute bony abnormality.
IMPRESSION: Left base atelectasis or scarring.

No acute findings.

## 2023-01-29 ENCOUNTER — Other Ambulatory Visit (HOSPITAL_BASED_OUTPATIENT_CLINIC_OR_DEPARTMENT_OTHER): Payer: Self-pay

## 2023-01-30 ENCOUNTER — Other Ambulatory Visit (HOSPITAL_BASED_OUTPATIENT_CLINIC_OR_DEPARTMENT_OTHER): Payer: Self-pay

## 2023-01-30 MED ORDER — ESTRADIOL 1 MG PO TABS
1.0000 mg | ORAL_TABLET | Freq: Every day | ORAL | 3 refills | Status: AC
  Filled 2023-01-30: qty 30, 30d supply, fill #0
  Filled 2023-03-08: qty 30, 30d supply, fill #1
  Filled 2023-04-17: qty 30, 30d supply, fill #2

## 2023-01-31 ENCOUNTER — Other Ambulatory Visit (HOSPITAL_BASED_OUTPATIENT_CLINIC_OR_DEPARTMENT_OTHER): Payer: Self-pay

## 2023-03-08 ENCOUNTER — Other Ambulatory Visit (HOSPITAL_BASED_OUTPATIENT_CLINIC_OR_DEPARTMENT_OTHER): Payer: Self-pay

## 2023-03-30 ENCOUNTER — Other Ambulatory Visit (HOSPITAL_BASED_OUTPATIENT_CLINIC_OR_DEPARTMENT_OTHER): Payer: Self-pay

## 2023-04-01 IMAGING — US US ABDOMEN LIMITED
1 series · 14 of 25 positions shown · non-contrast
Comparison: None.

CLINICAL DATA: Elevated liver enzymes.

EXAM:
ULTRASOUND ABDOMEN LIMITED RIGHT UPPER QUADRANT

[Series 1: us abdomen limited · 14 of 32 slices shown]
[im 1/32]
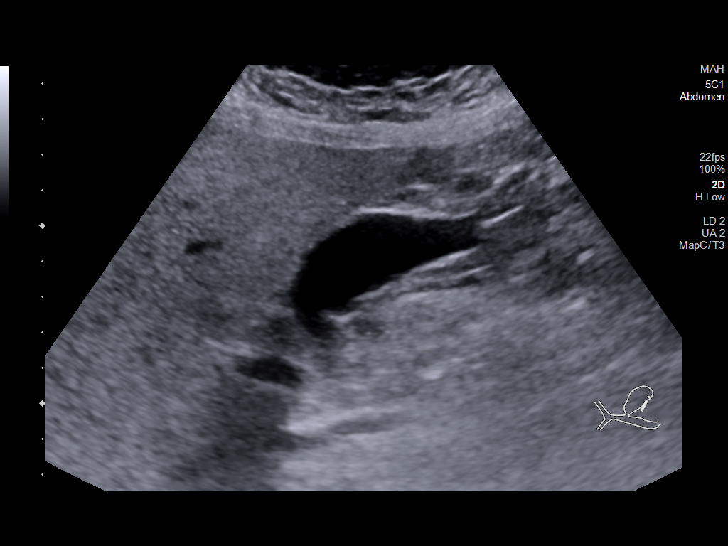
[im 3/32]
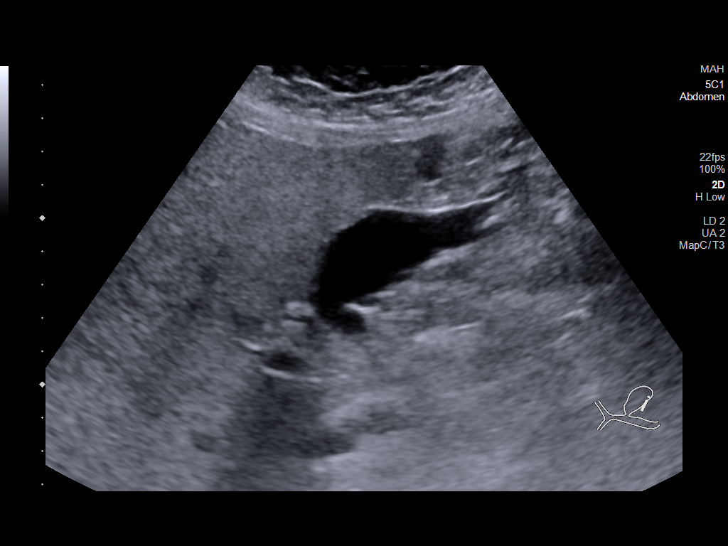
[im 6/32]
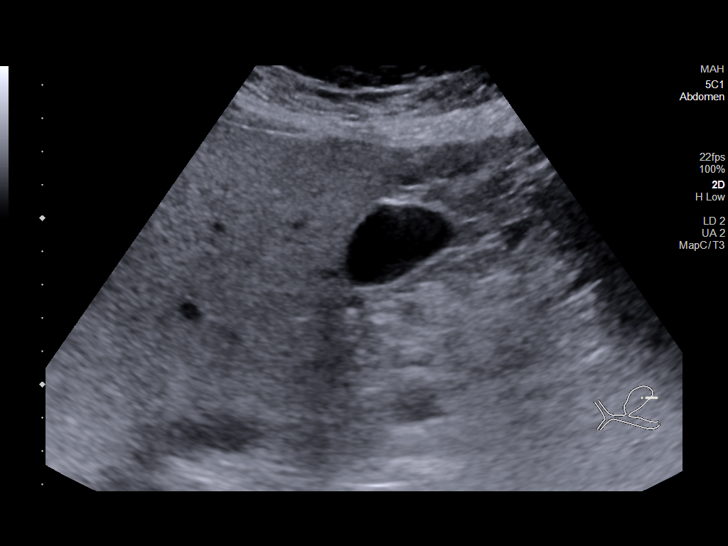
[im 8/32]
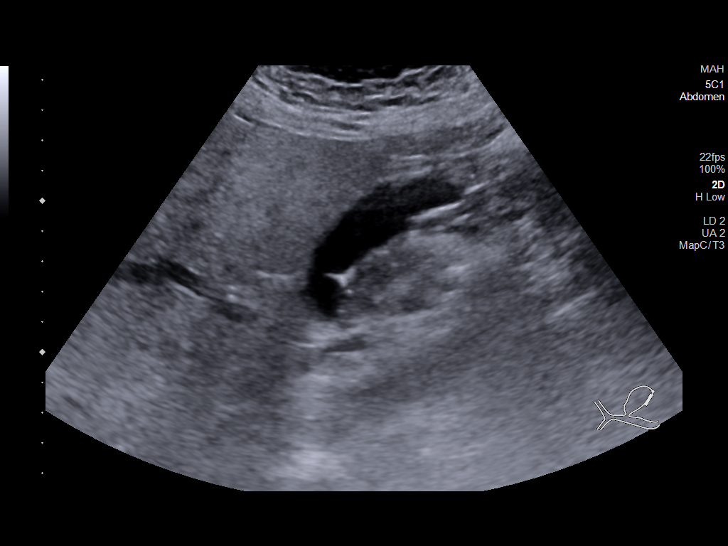
[im 11/32]
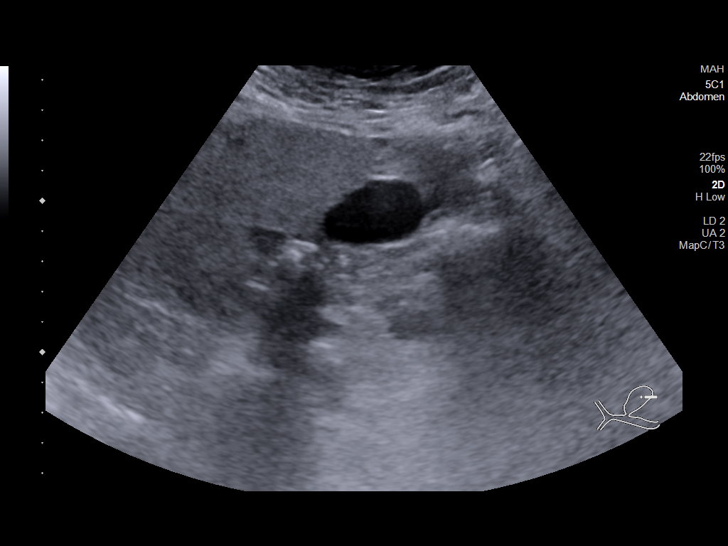
[im 12/32]
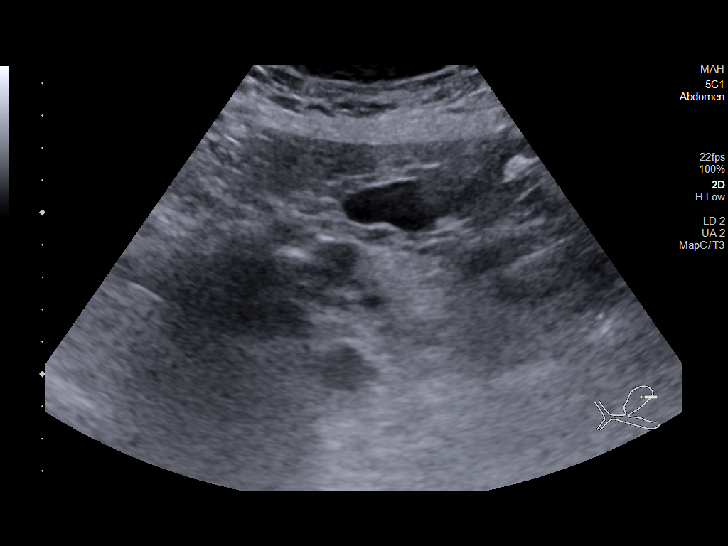
[im 15/32]
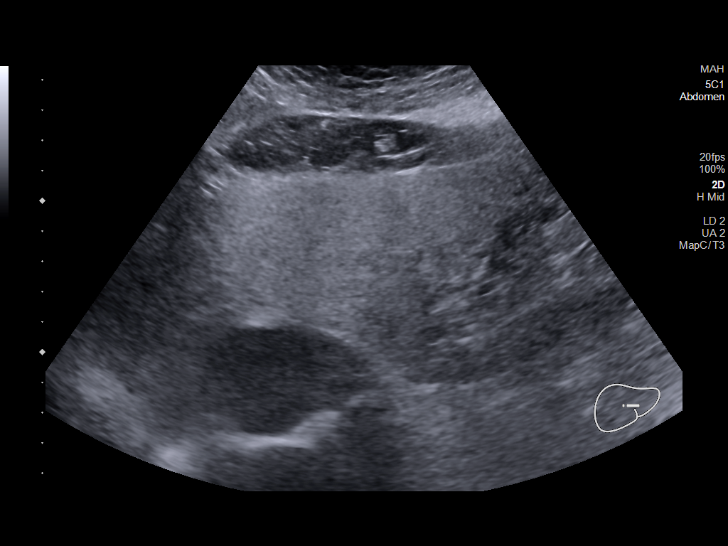
[im 17/32]
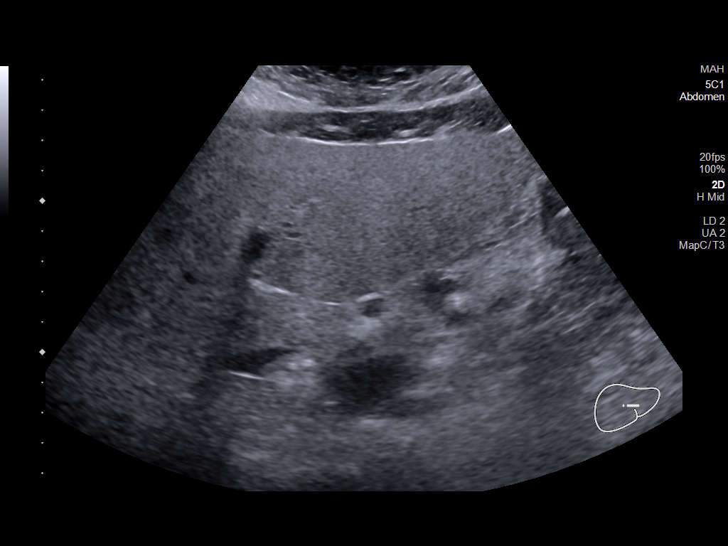
[im 20/32]
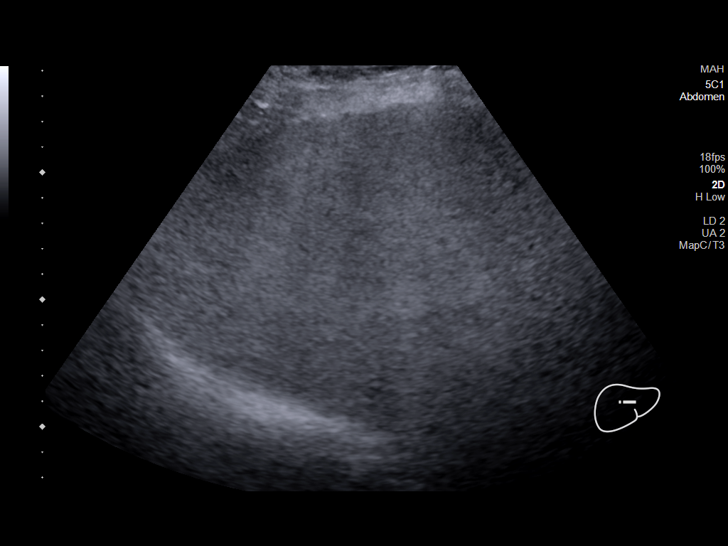
[im 21/32]
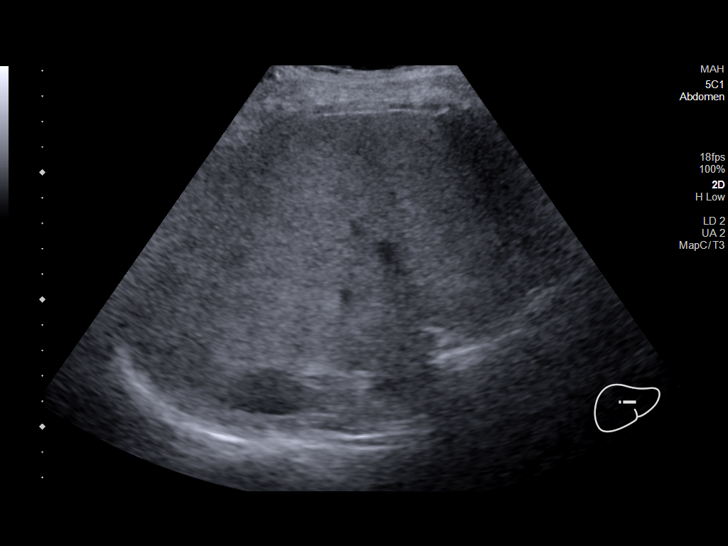
[im 24/32]
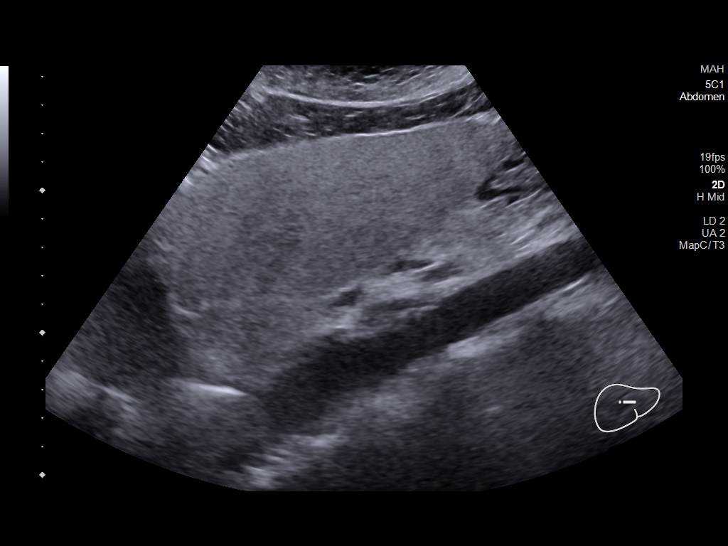
[im 26/32]
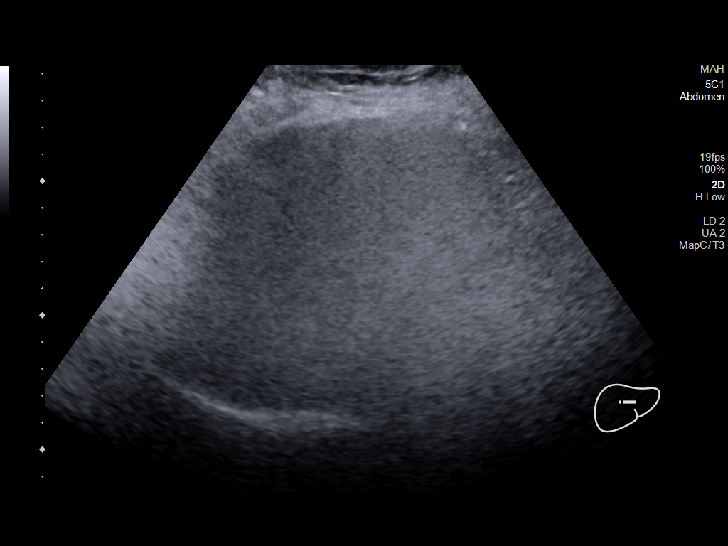
[im 29/32]
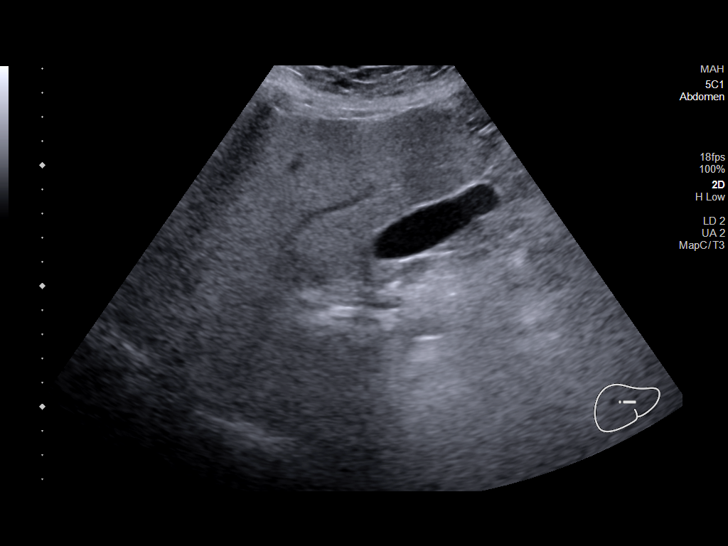
[im 32/32]
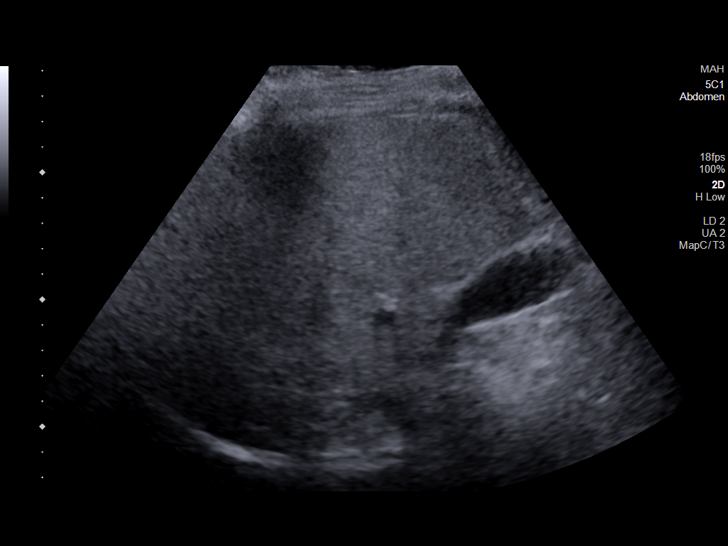

[14 of 25 positions shown; findings below may reference images not displayed]

FINDINGS: Gallbladder:

No gallstones or wall thickening visualized. No sonographic Murphy
sign noted by sonographer.

Common bile duct:

Diameter: 4 mm.

Liver:

No focal lesion identified. Increased parenchymal echogenicity.
Portal vein is patent on color Doppler imaging with normal direction
of blood flow towards the liver.

Other: None.
IMPRESSION: Hepatic steatosis. Please note limited evaluation for focal hepatic
masses in a patient with hepatic steatosis due to decreased
penetration of the acoustic ultrasound waves.

## 2023-04-17 ENCOUNTER — Other Ambulatory Visit (HOSPITAL_BASED_OUTPATIENT_CLINIC_OR_DEPARTMENT_OTHER): Payer: Self-pay

## 2023-04-17 MED ORDER — AMLODIPINE BESYLATE 5 MG PO TABS
5.0000 mg | ORAL_TABLET | Freq: Two times a day (BID) | ORAL | 2 refills | Status: AC
  Filled 2023-04-17: qty 60, 30d supply, fill #0

## 2023-04-18 ENCOUNTER — Other Ambulatory Visit (HOSPITAL_BASED_OUTPATIENT_CLINIC_OR_DEPARTMENT_OTHER): Payer: Self-pay

## 2023-05-18 ENCOUNTER — Other Ambulatory Visit (HOSPITAL_BASED_OUTPATIENT_CLINIC_OR_DEPARTMENT_OTHER): Payer: Self-pay
